# Patient Record
Sex: Male | Born: 1980 | Race: White | Hispanic: No | Marital: Single | Smoking: Current every day smoker
Health system: Southern US, Academic
[De-identification: ages and names within clinical notes are randomized; demographics above are authoritative.]

## PROBLEM LIST (undated history)

## (undated) DIAGNOSIS — Z789 Other specified health status: Secondary | ICD-10-CM

## (undated) HISTORY — PX: HIP SURGERY: SHX245

## (undated) HISTORY — PX: HX APPENDECTOMY: SHX54

## (undated) HISTORY — PX: HX HERNIA REPAIR: SHX51

---
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
## 2010-02-08 ENCOUNTER — Emergency Department (HOSPITAL_COMMUNITY): Payer: Self-pay

## 2010-02-08 ENCOUNTER — Emergency Department (EMERGENCY_DEPARTMENT_HOSPITAL): Payer: Self-pay

## 2010-02-08 ENCOUNTER — Emergency Department
Admission: EM | Admit: 2010-02-08 | Discharge: 2010-02-08 | Disposition: A | Discharge status: Home / Self Care | Payer: Self-pay | Attending: Emergency Medicine | Admitting: Emergency Medicine

## 2010-02-08 ENCOUNTER — Encounter (HOSPITAL_COMMUNITY): Payer: Self-pay

## 2010-02-08 DIAGNOSIS — M25519 Pain in unspecified shoulder: Secondary | ICD-10-CM | POA: Insufficient documentation

## 2010-02-08 DIAGNOSIS — M542 Cervicalgia: Secondary | ICD-10-CM | POA: Insufficient documentation

## 2010-02-08 DIAGNOSIS — M549 Dorsalgia, unspecified: Secondary | ICD-10-CM | POA: Insufficient documentation

## 2010-02-08 DIAGNOSIS — S0990XA Unspecified injury of head, initial encounter: Secondary | ICD-10-CM | POA: Insufficient documentation

## 2010-02-08 MED ORDER — CYCLOBENZAPRINE 5 MG TABLET
5.00 mg | ORAL_TABLET | Freq: Three times a day (TID) | ORAL | Status: AC
Start: 2010-02-08 — End: ?

## 2010-02-08 MED ORDER — HYDROCODONE 5 MG-ACETAMINOPHEN 325 MG TABLET
1.00 | ORAL_TABLET | ORAL | Status: AC | PRN
Start: 2010-02-08 — End: ?

## 2010-02-08 NOTE — ED Nurses Note (Signed)
(  2025): Pending dispo. No new needs/complaints.
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
## 2010-02-08 NOTE — ED Nurses Note (Signed)
(  1920): Imaging completed. Pending dictations.

## 2010-02-08 NOTE — Discharge Instructions (Signed)
General Instructions:   You are considered stable for discharge from the emergency department.  Please carefully follow all the instructions you were given verbally as well as the written instructions given below.  In general, immediately return to the emergency department if the symptoms you presented with today increase in severity, change in any way, and/or do not improve in what you consider an acceptable time frame.  Return if you develop fever >101, vomiting, oral liquid intolerance, chest pain, shortness of breath, weakness, change in behavior, or any other concerns.    Medication(s) Instructions (if applicable):   If you were given any medication(s) upon discharge, please strictly follow the directions as prescribed for taking the medication(s).  Should you feel you develop any type of reaction to the medication(s), including, but not limited to, rash, swelling of the lips or face, or difficulty breathing, immediately discontinue the use of the medication(s) and seek prompt medical care.  Please read the medication(s) insert provided by the pharmacy and follow all guidelines and recommendations.       Follow-Up Instructions:   If you were given instructions to follow-up with a health care provider upon discharge, please be sure to do so.  It is your responsibility to call and/or make an appointment with the health care providers listed on your discharge papers and/or your primary care provider in the appropriate time frame given.  Please take a copy of your discharge papers with you to your follow-up appointment(s).       YOU MUST CALL THE NUMBER LISTED ON THE DISCHARGE PAPERWORK TO CONFIRM YOUR APPOINTMENT.      Special Information / Instructions:     Please read and follow all attached discharge instructions.      Please call the [facility] Emergency Department at [phone] with any questions or concerns.    Shoulder Pain     You were seen today with shoulder pain. The shoulder is a ball and socket joint.  The muscles and tendons (rotator cuff) are what keep the shoulder in its joint and stable. This collection of muscles and tendons holds in the head (ball) of the humerus (upper arm bone) in the fossa (cup) of the scapula (shoulder blade). Today no reason was found for your shoulder pain. Often pain in the shoulder may be treated conservatively with temporary immobilization. For example, holding the shoulder in one place using a sling for rest. Physical therapy may be needed if problems continue.     HOME CARE INSTRUCTIONS   Apply ice to the sore area for 15 minutes 4 times per day for the first 2 days. Put the ice in a plastic bag. Place a towel between the bag of ice and your skin.     You may want to sleep on several pillows at night to lessen swelling and pain.   Only take over-the-counter or prescription medicines for pain, discomfort, or fever as directed by your caregiver.    It is very important to keep any follow-up appointments in order to avoid any type of permanent shoulder disability or chronic pain problems.     SEEK MEDICAL CARE IF:   Pain in your shoulder increases or new pain develops in your arm, hand, or fingers.   Your hand or fingers are colder than your other hand.   You do not obtain pain relief with the medications or your pain becomes worse.     SEEK IMMEDIATE MEDICAL CARE IF:   Your arm, hand, or fingers are  numb or tingling.   Your arm, hand, or fingers are swollen, painful, or turn white or blue.   You develop chest pain or shortness of breath.     MAKE SURE YOU:    Understand these instructions.    Will watch your condition.   Will get help right away if you are not doing well or get worse.     Document Released: 03/15/2005  Document Re-Released: 09/01/2008  [facility] Patient Information 2011 [place], [place].    Blunt Trauma     You have been evaluated for injuries. You have been examined and your caregiver has not found injuries serious enough to require hospitalization.      It is common to have multiple bruises and sore muscles following an accident. These tend to feel worse for the first 24 hours. You will feel more stiffness and soreness over the next several hours and worse when you wake up the first morning after your accident. After this point, you should begin to improve with each passing day. The amount of improvement depends on the amount of damage done in the accident.     Following your accident, if some part of your body does not work as it should, or if the pain in any area continues to increase, you should return to the Emergency Department for re-evaluation.      HOME CARE INSTRUCTIONS  Routine care for sore areas should include:   Ice to sore areas every 2 hours for 20 minutes while awake for the next 2 days.   Drink extra fluids (not alcohol).   Take a hot or warm shower or bath once or twice a day to increase blood flow to sore muscles. This will help you limber up.   Activity as tolerated. Lifting may aggravate neck or back pain.   Only take over-the-counter or prescription medicines for pain, discomfort, or fever as directed by your caregiver. Do not use aspirin. This may increase bruising or increase bleeding if there are small areas where this is happening.     SEEK IMMEDIATE MEDICAL ATTENTION IF:   Numbness, tingling, weakness, or problem with the use of your arms or legs.   Severe headache not relieved with medications.   Change in bowel or bladder control.   Increasing pain in any areas of the body.   Shortness of breath, dizziness.   Nausea, vomiting or sweats.   Increasing belly (abdominal) discomfort.   Blood in urine, stool, or vomiting blood.   Pain in either shoulder in an area where a shoulder strap would be.   Feelings of lightheadedness or if you have a fainting episode.     Sometimes it is not possible to identify all injuries immediately after the trauma. It is important that you continue to monitor your condition after the  emergency department visit. If you feel you are not improving, or improving more slowly than should be expected, call your physician. If you feel your symptoms (problems) are worsening, return to the Emergency Department immediately.     Document Released: 03/01/2001  Document Re-Released: 06/27/2009  [facility] Patient Information 2011 [place], [place].

## 2010-02-08 NOTE — ED Nurses Note (Signed)
(  1828): Pending imaging. No needs at this time.

## 2010-02-08 NOTE — ED Nurses Note (Signed)
(  2055): Pt discharged in stable condition - per order. Pt received discharge and follow-up instructions, informational handout and 2RX(s). Pt verbalized understanding of all instructions and had no questions upon completion of discharge. Pt ambulatory at time of discharge. His c-spine had been cleared by Dr. Ross Marcus prior to him being discharged.
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
## 2021-07-25 ENCOUNTER — Emergency Department (HOSPITAL_COMMUNITY): Payer: Self-pay

## 2021-07-25 ENCOUNTER — Emergency Department
Admission: EM | Admit: 2021-07-25 | Discharge: 2021-07-25 | Disposition: A | Discharge status: Other Acute Inpatient Hospital | Payer: Medicaid Other | Attending: Emergency Medicine | Admitting: Emergency Medicine

## 2021-07-25 ENCOUNTER — Encounter (HOSPITAL_COMMUNITY): Payer: Self-pay

## 2021-07-25 ENCOUNTER — Other Ambulatory Visit: Payer: Self-pay

## 2021-07-25 DIAGNOSIS — T23232A Burn of second degree of multiple left fingers (nail), not including thumb, initial encounter: Secondary | ICD-10-CM | POA: Insufficient documentation

## 2021-07-25 DIAGNOSIS — T23292A Burn of second degree of multiple sites of left wrist and hand, initial encounter: Secondary | ICD-10-CM

## 2021-07-25 DIAGNOSIS — T310 Burns involving less than 10% of body surface: Secondary | ICD-10-CM

## 2021-07-25 DIAGNOSIS — T23201A Burn of second degree of right hand, unspecified site, initial encounter: Secondary | ICD-10-CM | POA: Insufficient documentation

## 2021-07-25 DIAGNOSIS — T23291A Burn of second degree of multiple sites of right wrist and hand, initial encounter: Secondary | ICD-10-CM

## 2021-07-25 HISTORY — DX: Other specified health status: Z78.9

## 2021-07-25 MED ORDER — OXYCODONE-ACETAMINOPHEN 5 MG-325 MG TABLET
2.00 | ORAL_TABLET | ORAL | Status: AC
Start: 2021-07-25 — End: 2021-07-25
  Administered 2021-07-25: 2 via ORAL
  Filled 2021-07-25: qty 2
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
## 2021-08-10 ENCOUNTER — Encounter (HOSPITAL_COMMUNITY): Payer: Self-pay

## 2021-08-10 ENCOUNTER — Other Ambulatory Visit: Payer: Self-pay

## 2021-08-10 ENCOUNTER — Emergency Department
Admission: EM | Admit: 2021-08-10 | Discharge: 2021-08-10 | Disposition: A | Discharge status: Home / Self Care | Payer: Medicaid Other | Attending: Physician Assistant | Admitting: Physician Assistant

## 2021-08-10 DIAGNOSIS — X19XXXA Contact with other heat and hot substances, initial encounter: Secondary | ICD-10-CM

## 2021-08-10 DIAGNOSIS — T23209A Burn of second degree of unspecified hand, unspecified site, initial encounter: Secondary | ICD-10-CM

## 2021-08-10 DIAGNOSIS — T23201A Burn of second degree of right hand, unspecified site, initial encounter: Secondary | ICD-10-CM

## 2021-08-10 DIAGNOSIS — X16XXXA Contact with hot heating appliances, radiators and pipes, initial encounter: Secondary | ICD-10-CM | POA: Insufficient documentation

## 2021-08-10 DIAGNOSIS — F17210 Nicotine dependence, cigarettes, uncomplicated: Secondary | ICD-10-CM | POA: Insufficient documentation

## 2021-08-10 DIAGNOSIS — Y9384 Activity, sleeping: Secondary | ICD-10-CM | POA: Insufficient documentation

## 2021-08-10 DIAGNOSIS — T23202A Burn of second degree of left hand, unspecified site, initial encounter: Secondary | ICD-10-CM | POA: Insufficient documentation

## 2021-08-10 MED ORDER — SILVER SULFADIAZINE 1 % TOPICAL CREAM
TOPICAL_CREAM | Freq: Every day | CUTANEOUS | 0 refills | Status: AC
Start: 2021-08-10 — End: 2021-08-17

## 2021-08-10 MED ORDER — CEPHALEXIN 500 MG CAPSULE
500.0000 mg | ORAL_CAPSULE | Freq: Three times a day (TID) | ORAL | 0 refills | Status: AC
Start: 2021-08-10 — End: 2021-08-20

## 2021-08-10 MED ORDER — SILVER SULFADIAZINE 1 % TOPICAL CREAM
TOPICAL_CREAM | Freq: Every day | CUTANEOUS | Status: DC
Start: 2021-08-10 — End: 2021-08-10
  Filled 2021-08-10: qty 50
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
## 2021-08-10 NOTE — ED Nurses Note (Signed)
Silvadine cream applied to bilateral hands and fingers, dressing applied to hands and fingers. Patient tolerated procedure. Dressing supplies sent home with patient.
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
## 2021-08-29 ENCOUNTER — Emergency Department
Admission: EM | Admit: 2021-08-29 | Discharge: 2021-08-29 | Disposition: A | Discharge status: Home / Self Care | Payer: Medicaid Other | Attending: NURSE PRACTITIONER | Admitting: NURSE PRACTITIONER

## 2021-08-29 ENCOUNTER — Other Ambulatory Visit: Payer: Self-pay

## 2021-08-29 ENCOUNTER — Emergency Department (HOSPITAL_COMMUNITY): Payer: Medicaid Other

## 2021-08-29 ENCOUNTER — Encounter (HOSPITAL_COMMUNITY): Payer: Self-pay

## 2021-08-29 DIAGNOSIS — X501XXA Overexertion from prolonged static or awkward postures, initial encounter: Secondary | ICD-10-CM | POA: Insufficient documentation

## 2021-08-29 DIAGNOSIS — S93401A Sprain of unspecified ligament of right ankle, initial encounter: Secondary | ICD-10-CM | POA: Insufficient documentation
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
## 2021-08-29 NOTE — ED Triage Notes (Signed)
Twisted right ankle this morning; having pain
